# Patient Record
Sex: Female | Born: 1990 | Race: White | Hispanic: No | Marital: Married | Smoking: Never smoker
Health system: Southern US, Community
[De-identification: ages and names within clinical notes are randomized; demographics above are authoritative.]

## PROBLEM LIST (undated history)

## (undated) DIAGNOSIS — R102 Pelvic and perineal pain: Secondary | ICD-10-CM

## (undated) DIAGNOSIS — R519 Headache, unspecified: Secondary | ICD-10-CM

## (undated) DIAGNOSIS — N809 Endometriosis, unspecified: Secondary | ICD-10-CM

## (undated) DIAGNOSIS — T4145XA Adverse effect of unspecified anesthetic, initial encounter: Secondary | ICD-10-CM

## (undated) DIAGNOSIS — K219 Gastro-esophageal reflux disease without esophagitis: Secondary | ICD-10-CM

## (undated) DIAGNOSIS — T8859XA Other complications of anesthesia, initial encounter: Secondary | ICD-10-CM

## (undated) DIAGNOSIS — Z8489 Family history of other specified conditions: Secondary | ICD-10-CM

## (undated) HISTORY — PX: WISDOM TOOTH EXTRACTION: SHX21

---
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
## 2012-07-16 ENCOUNTER — Telehealth: Payer: Self-pay | Admitting: Genetic Counselor

## 2012-07-16 NOTE — Telephone Encounter (Signed)
S/W pt in re Genetic Testing 1/13 @ 11 w/[employer] [employer]. Welcome packet mailed.

## 2012-09-27 ENCOUNTER — Encounter: Payer: Self-pay | Admitting: Genetic Counselor

## 2012-09-27 ENCOUNTER — Other Ambulatory Visit: Payer: Self-pay | Admitting: Lab

## 2012-09-29 ENCOUNTER — Telehealth: Payer: Self-pay | Admitting: Genetic Counselor

## 2012-09-29 NOTE — Telephone Encounter (Signed)
Per message from switchboard left 1/13. Pt called to cx'd 1/13 appt due to she is now living out of town.

## 2015-03-26 ENCOUNTER — Encounter: Payer: Self-pay | Admitting: Adult Health

## 2015-05-20 ENCOUNTER — Emergency Department (HOSPITAL_COMMUNITY)
Admission: EM | Admit: 2015-05-20 | Discharge: 2015-05-20 | Disposition: A | Discharge status: Home / Self Care | Payer: BC Managed Care – PPO | Source: Home / Self Care | Attending: Family Medicine | Admitting: Family Medicine

## 2015-05-20 ENCOUNTER — Encounter (HOSPITAL_COMMUNITY): Payer: Self-pay | Admitting: Emergency Medicine

## 2015-05-20 DIAGNOSIS — H9201 Otalgia, right ear: Secondary | ICD-10-CM | POA: Diagnosis not present

## 2015-05-20 DIAGNOSIS — H6501 Acute serous otitis media, right ear: Secondary | ICD-10-CM

## 2015-05-20 MED ORDER — AMOXICILLIN 500 MG PO CAPS: 500.0000 mg | ORAL_CAPSULE | Freq: Two times a day (BID) | ORAL | Status: DC

## 2015-05-20 MED ORDER — IBUPROFEN 800 MG PO TABS
800.0000 mg | ORAL_TABLET | Freq: Three times a day (TID) | ORAL | Status: DC
Start: 2015-05-20 — End: 2019-11-07

## 2015-05-20 MED ORDER — IBUPROFEN 800 MG PO TABS
ORAL_TABLET | ORAL | Status: AC
  Filled 2015-05-20: qty 1

## 2015-05-20 MED ORDER — IBUPROFEN 800 MG PO TABS
800.0000 mg | ORAL_TABLET | Freq: Once | ORAL | Status: AC
  Administered 2015-05-20: 800 mg via ORAL

## 2015-05-20 NOTE — ED Notes (Signed)
C/o right ear pain onset yest associated w/dizziness Has applied OTC ear drops that has made it worse Alert... No acute distress.

## 2015-05-20 NOTE — Discharge Instructions (Signed)
Otitis Media With Effusion Otitis media with effusion is the presence of fluid in the middle ear. This is a common problem in children, which often follows ear infections. It may be present for weeks or longer after the infection. Unlike an acute ear infection, otitis media with effusion refers only to fluid behind the ear drum and not infection. Children with repeated ear and sinus infections and allergy problems are the most likely to get otitis media with effusion. CAUSES  The most frequent cause of the fluid buildup is dysfunction of the eustachian tubes. These are the tubes that drain fluid in the ears to the back of the nose (nasopharynx). SYMPTOMS   The main symptom of this condition is hearing loss. As a result, you or your child may:  Listen to the TV at a loud volume.  Not respond to questions.  Ask "what" often when spoken to.  Mistake or confuse one sound or word for another.  There may be a sensation of fullness or pressure but usually not pain. DIAGNOSIS   Your health care provider will diagnose this condition by examining you or your child's ears.  Your health care provider may test the pressure in you or your child's ear with a tympanometer.  A hearing test may be conducted if the problem persists. TREATMENT   Treatment depends on the duration and the effects of the effusion.  Antibiotics, decongestants, nose drops, and cortisone-type drugs (tablets or nasal spray) may not be helpful.  Children with persistent ear effusions may have delayed language or behavioral problems. Children at risk for developmental delays in hearing, learning, and speech may require referral to a specialist earlier than children not at risk.  You or your child's health care provider may suggest a referral to an ear, nose, and throat surgeon for treatment. The following may help restore normal hearing:  Drainage of fluid.  Placement of ear tubes (tympanostomy tubes).  Removal of adenoids  (adenoidectomy). HOME CARE INSTRUCTIONS   Avoid secondhand smoke.  Infants who are breastfed are less likely to have this condition.  Avoid feeding infants while they are lying flat.  Avoid known environmental allergens.  Avoid people who are sick. SEEK MEDICAL CARE IF:   Hearing is not better in 3 months.  Hearing is worse.  Ear pain.  Drainage from the ear.  Dizziness. MAKE SURE YOU:   Understand these instructions.  Will watch your condition.  Will get help right away if you are not doing well or get worse. Document Released: 10/09/2004 Document Revised: 01/16/2014 Document Reviewed: 03/29/2013 [facility] Patient Information 2015 [place], [place]. This information is not intended to replace advice given to you by your health care provider. Make sure you discuss any questions you have with your health care provider.    You have an ear infection. Treat with antibiotics-please take all of them. Use Ibuprofen (RX) every 8 hours in conjenction with Tylenol ES every 4 hours. Feel better soon. Also Mucinex Maxium Strength as directed will also help. Avoid decongestants!

## 2015-05-20 NOTE — ED Provider Notes (Signed)
CSN: [id number]     Arrival date & time 05/20/15  1834 History   First MD Initiated Contact with Patient 05/20/15 1857     Chief Complaint  Patient presents with  . Otalgia   (Consider location/radiation/quality/duration/timing/severity/associated sxs/prior Treatment) HPI Comments: Patient presents with "severe" right ear pain that began yesterday. No prior cold symptoms, no recent swimming. Subjective fevers and malaise is noted.   Patient is a 24 y.o. female presenting with ear pain. The history is provided by the patient.  Otalgia Associated symptoms: fever   Associated symptoms: no rhinorrhea and no sore throat     History reviewed. No pertinent past medical history. History reviewed. No pertinent past surgical history. No family history on file. Social History  Substance Use Topics  . Smoking status: Never Smoker   . Smokeless tobacco: None  . Alcohol Use: No   OB History    No data available     Review of Systems  Constitutional: Positive for fever and fatigue. Negative for chills.  HENT: Positive for ear pain. Negative for rhinorrhea, sinus pressure, sneezing and sore throat.   Eyes: Negative.   Respiratory: Negative.   Skin: Negative.   Neurological: Positive for dizziness.    Allergies  Review of patient's allergies indicates no known allergies.  Home Medications   Prior to Admission medications   Medication Sig Start Date End Date Taking? Authorizing Provider  amoxicillin (AMOXIL) 500 MG capsule Take 1 capsule (500 mg total) by mouth 2 (two) times daily. 05/20/15   Riki Sheer, PA-C  ibuprofen (ADVIL,MOTRIN) 800 MG tablet Take 1 tablet (800 mg total) by mouth 3 (three) times daily. 05/20/15   Riki Sheer, PA-C   Meds Ordered and Administered this Visit   Medications  ibuprofen (ADVIL,MOTRIN) tablet 800 mg (not administered)    BP 113/64 mmHg  Pulse 74  Temp(Src) 98.4 F (36.9 C) (Oral)  Resp 10  SpO2 99%  LMP 05/20/2015 No data  found.   Physical Exam  Constitutional: She is oriented to person, place, and time. She appears well-developed and well-nourished.  HENT:  Head: Normocephalic and atraumatic.  Left Ear: External ear normal.  Mouth/Throat: Oropharynx is clear and moist.  Right ear with retro TM fluid, effusion and erythema. Painful exam.   Neck: Normal range of motion.  Pulmonary/Chest: Effort normal.  Neurological: She is alert and oriented to person, place, and time.  Skin: Skin is warm and dry. No rash noted.  Psychiatric: Her behavior is normal.  Nursing note and vitals reviewed.   ED Course  Procedures (including critical care time)  Labs Review Labs Reviewed - No data to display  Imaging Review No results found.   Visual Acuity Review  Right Eye Distance:   Left Eye Distance:   Bilateral Distance:    Right Eye Near:   Left Eye Near:    Bilateral Near:         MDM   1. Right acute serous otitis media, recurrence not specified   2. Ear pain, right    Cover with antibiotic and supportive care. F/U if worsens.     Riki Sheer, PA-C 05/20/15 [phone]

## 2016-11-13 ENCOUNTER — Other Ambulatory Visit: Payer: Self-pay | Admitting: General Surgery

## 2016-11-13 DIAGNOSIS — N9089 Other specified noninflammatory disorders of vulva and perineum: Secondary | ICD-10-CM

## 2016-11-13 DIAGNOSIS — R221 Localized swelling, mass and lump, neck: Secondary | ICD-10-CM

## 2016-11-21 ENCOUNTER — Ambulatory Visit
Admission: RE | Admit: 2016-11-21 | Discharge: 2016-11-21 | Disposition: A | Discharge status: Home / Self Care | Payer: BC Managed Care – PPO | Source: Ambulatory Visit | Attending: General Surgery | Admitting: General Surgery

## 2016-11-21 DIAGNOSIS — R221 Localized swelling, mass and lump, neck: Secondary | ICD-10-CM

## 2016-11-21 DIAGNOSIS — N9089 Other specified noninflammatory disorders of vulva and perineum: Secondary | ICD-10-CM

## 2017-08-28 IMAGING — US US SOFT TISSUE HEAD/NECK
1 series · 3 of 3 positions shown · non-contrast
Comparison: None.

CLINICAL DATA: 25-year-old female with palpable abnormality of the
left neck near the angle of the mandible and the ear.

EXAM:
ULTRASOUND OF HEAD/NECK SOFT TISSUES
TECHNIQUE: Ultrasound examination of the head and neck soft tissues was
performed in the area of clinical concern.

[Series 1: us soft tissue head/neck · 0.08mm/px · 3 of 3 slices shown]
[im 1/3]
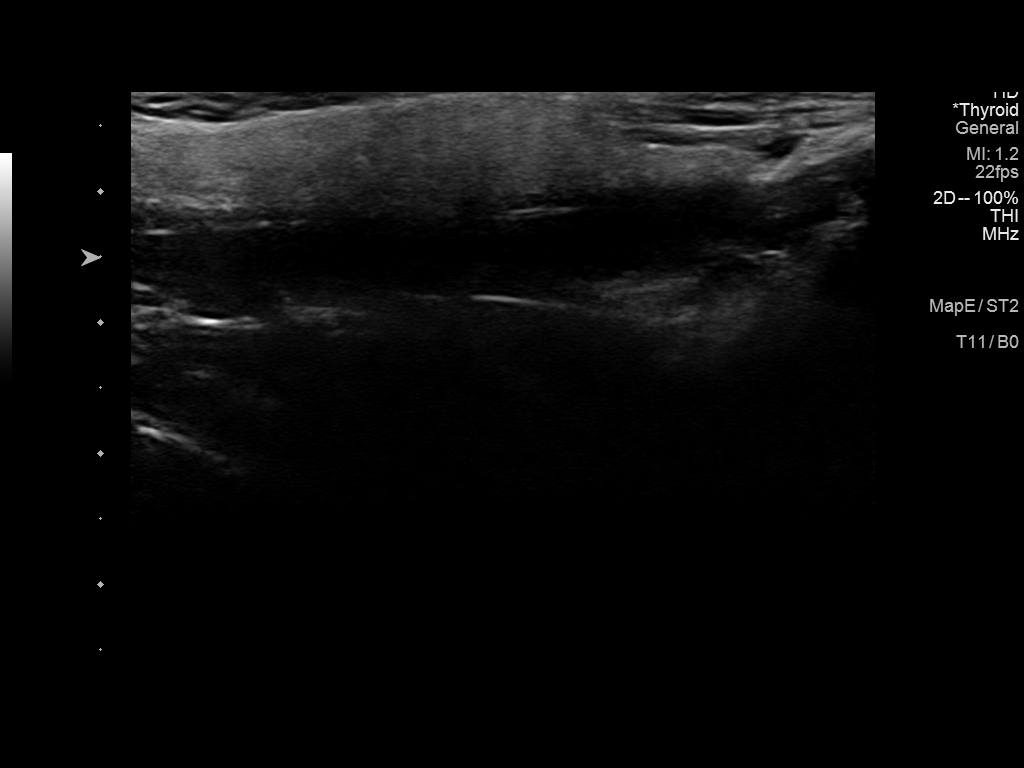
[im 2/3]
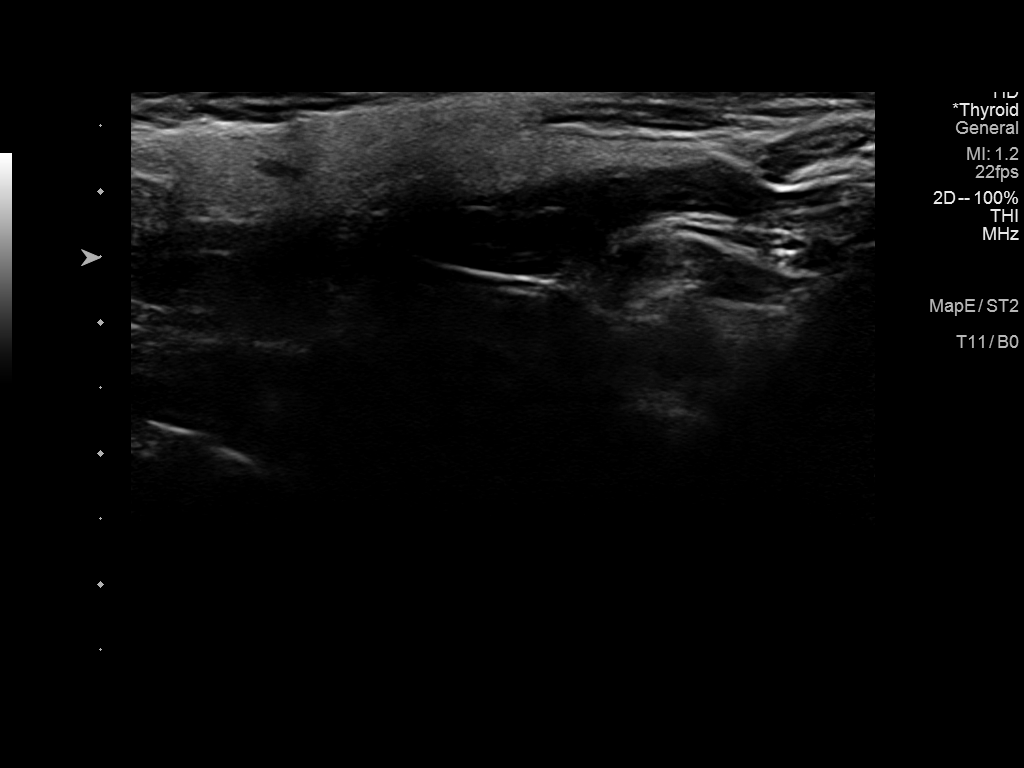
[im 3/3]
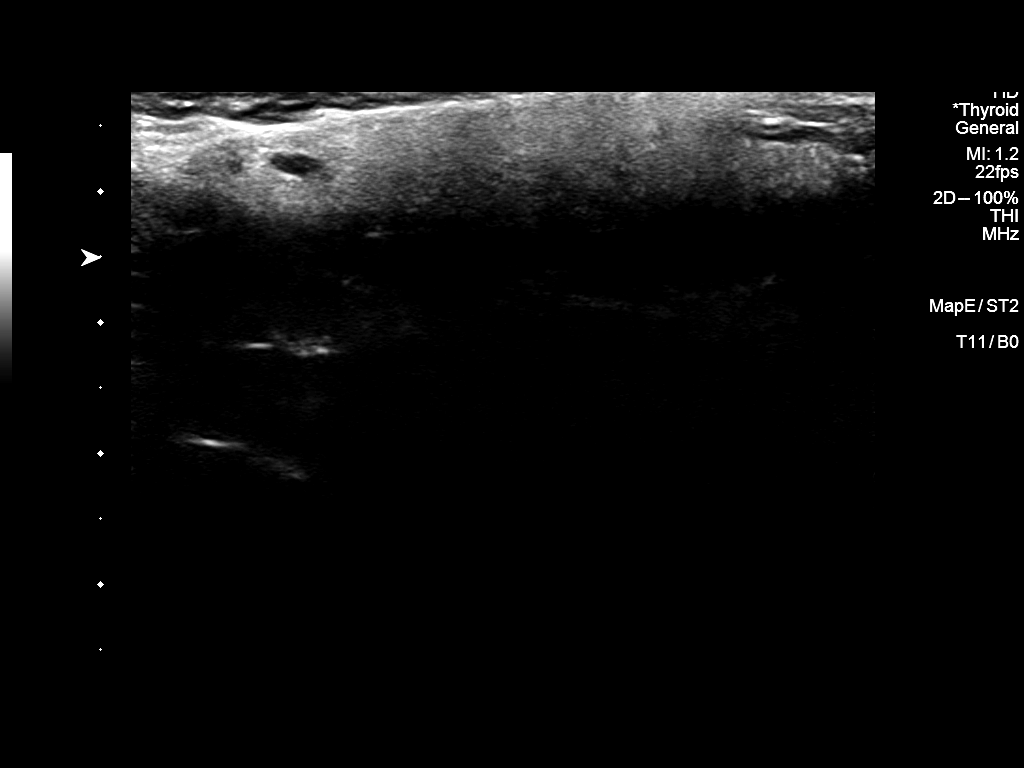

[3 of 3 positions shown; findings below may reference images not displayed]

FINDINGS: Grayscale images in the area of clinical concern demonstrate a
portion of the left parotid gland and otherwise normal appearing
fibromuscular architecture. No discrete mass or lesion is
identified.
IMPRESSION: No sonographic abnormality identified at the left neck area of
clinical concern.

If the area enlarges or fails to resolve then recommend follow-up
Neck CT with IV contrast.

## 2017-12-06 IMAGING — US US PELVIS LIMITED
1 series · 12 of 12 positions shown · non-contrast
Comparison: None.

CLINICAL DATA: Palpable knot in the right labia.

EXAM:
LIMITED ULTRASOUND OF PELVIS
TECHNIQUE: Limited transabdominal ultrasound examination of the pelvis was
performed.

[Series 1: us pelvis limited · 0.06mm/px · 12 of 12 slices shown]
[im 1/12]
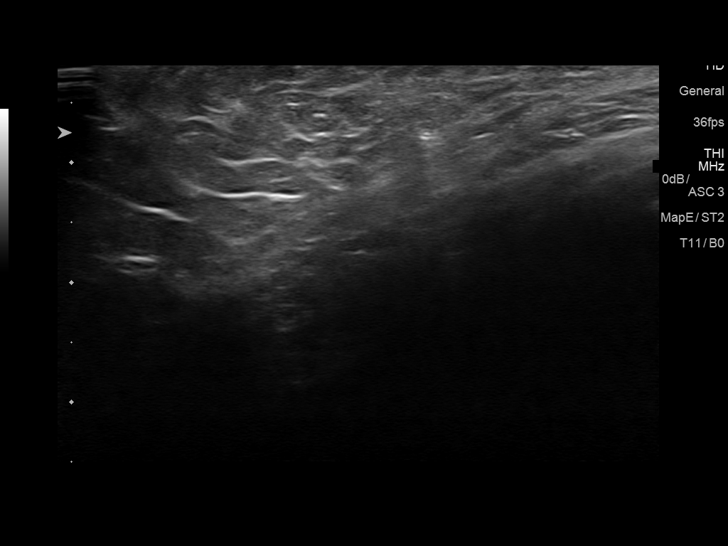
[im 2/12]
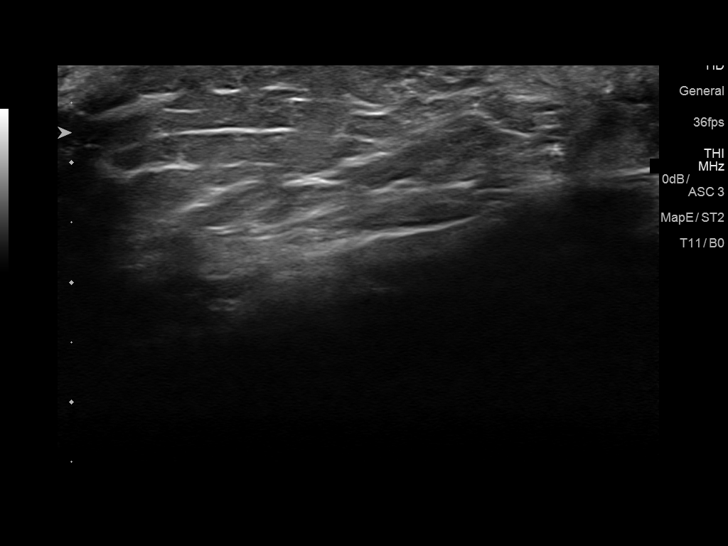
[im 3/12]
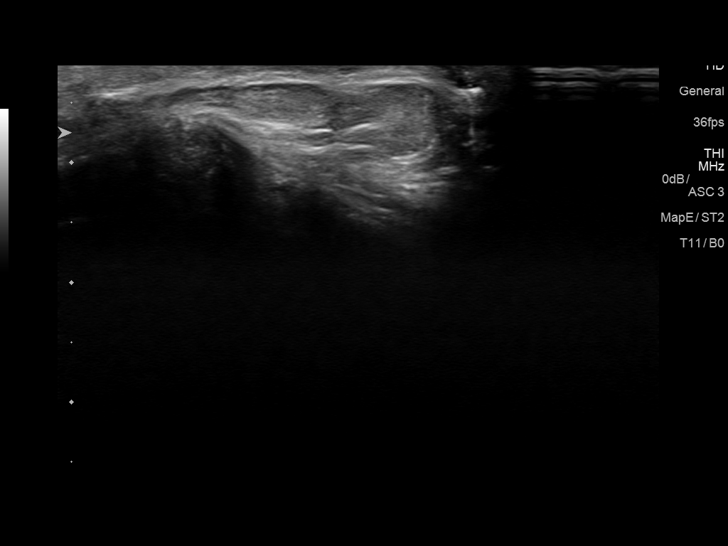
[im 4/12]
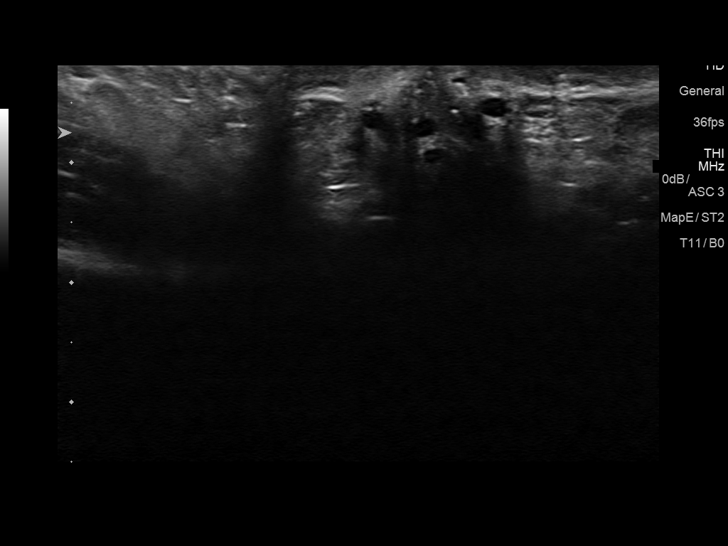
[im 5/12]
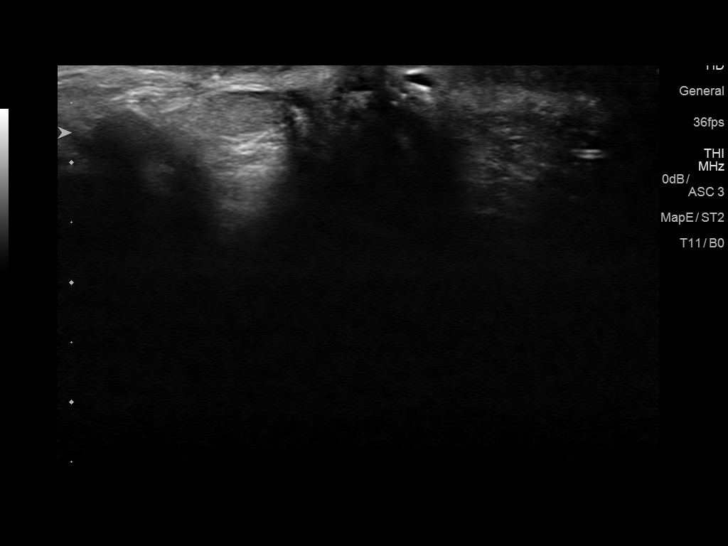
[im 6/12]
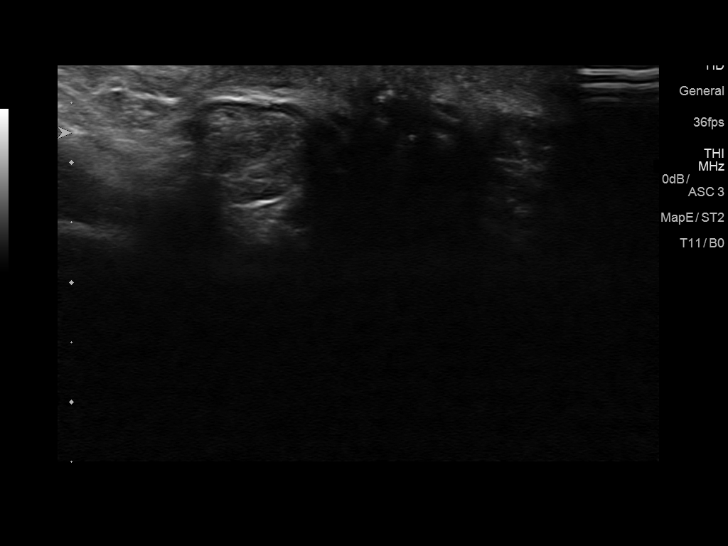
[im 7/12]
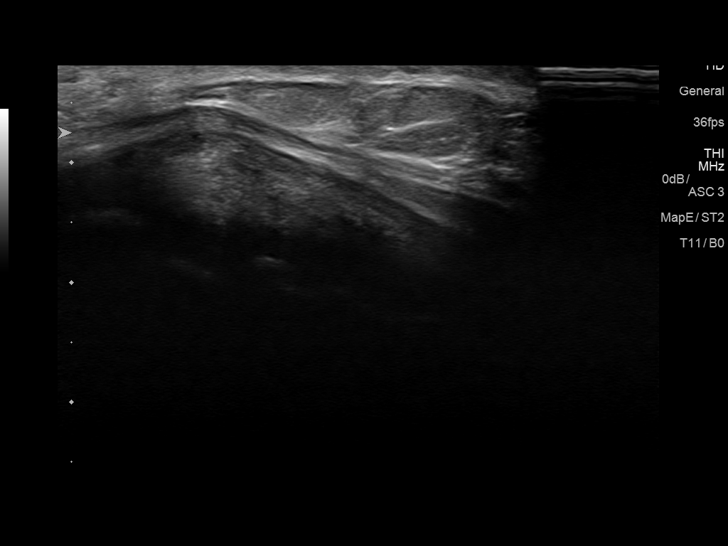
[im 8/12]
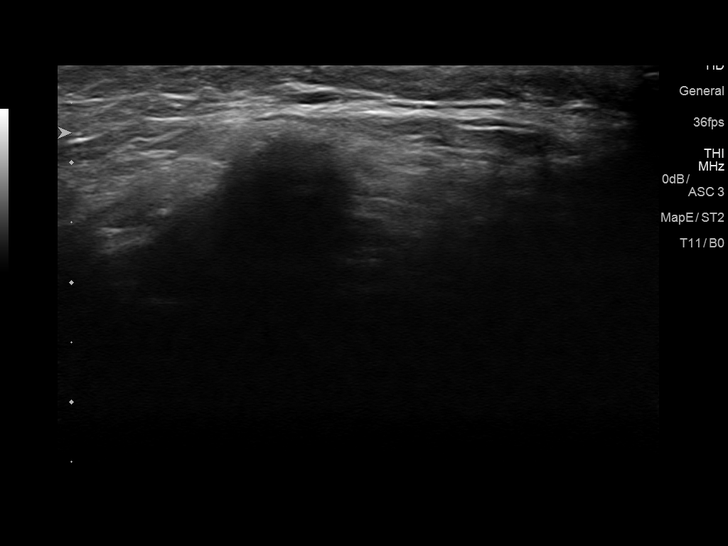
[im 9/12]
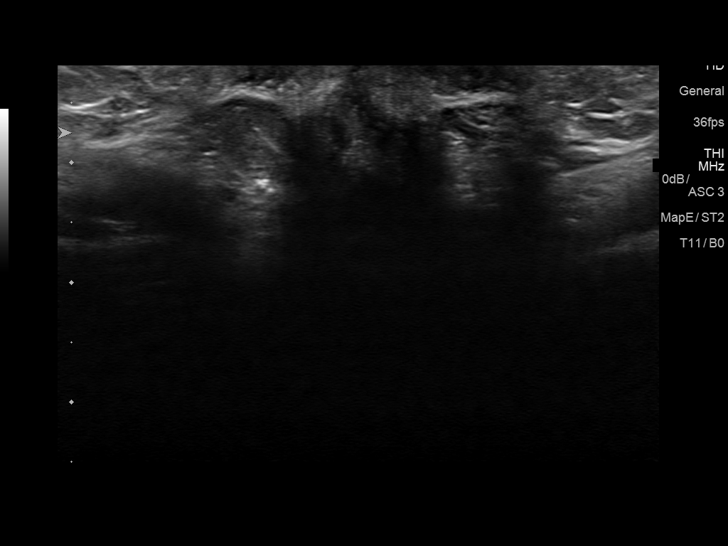
[im 10/12]
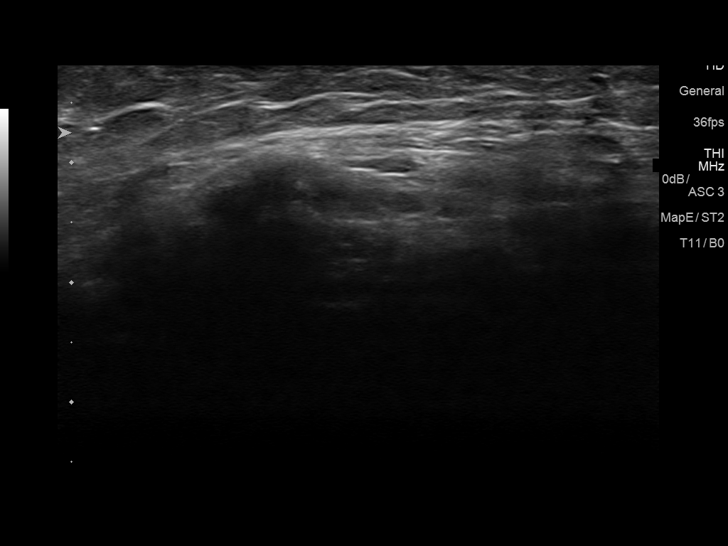
[im 11/12]
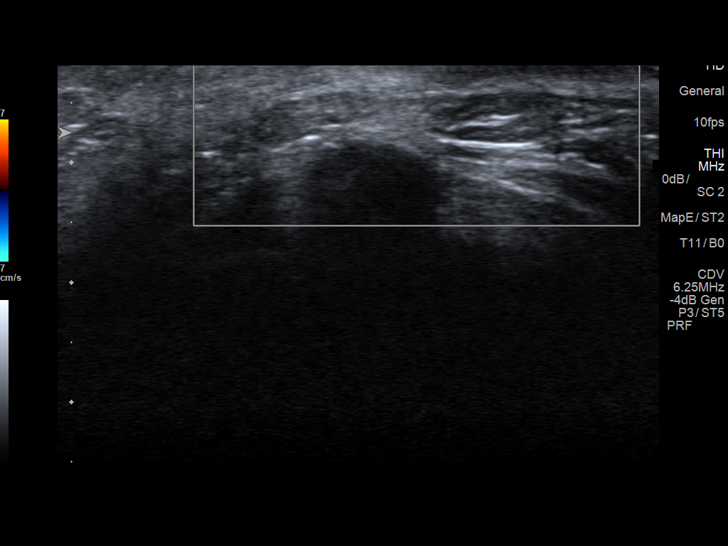
[im 12/12]
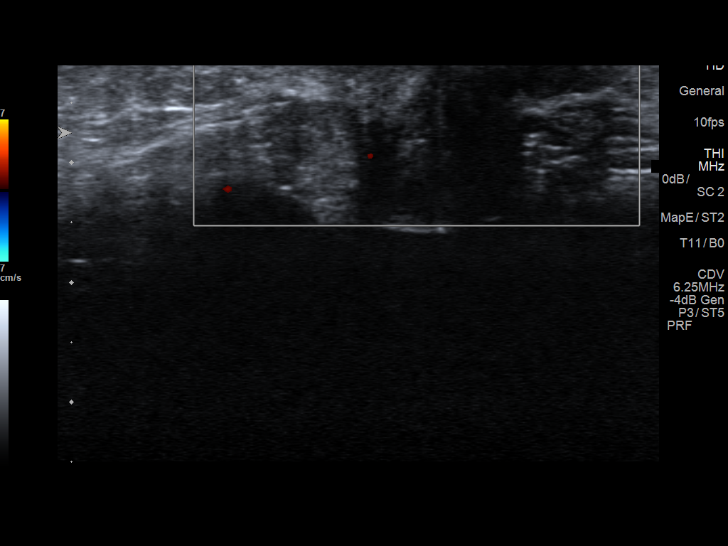

[12 of 12 positions shown; findings below may reference images not displayed]

FINDINGS: At the area of palpable concern as indicated by the patient in the
right labia, there is a 1.8 x 0.6 x 0.9 cm bilobed hypoechoic
subcutaneous mass with echogenicity similar to the surrounding fat
and no internal vascularity, calcification or cystic spaces
demonstrated. No additional masses are demonstrated.
IMPRESSION: Bilobed 1.8 x 0.6 x 0.9 cm hypoechoic subcutaneous mass identified
at the area of palpable concern as indicated by the patient in the
right labia. This mass is indeterminate and may represent a lipoma
given echogenicity similar to the surrounding fat. Further
management, including any decision to pursue follow-up ultrasound or
additional imaging such as MRI pelvis without and with IV contrast,
should be based on clinical assessment.

## 2019-11-03 ENCOUNTER — Other Ambulatory Visit: Payer: Self-pay | Admitting: Obstetrics and Gynecology

## 2019-11-05 ENCOUNTER — Other Ambulatory Visit (HOSPITAL_COMMUNITY)
Admission: RE | Admit: 2019-11-05 | Discharge: 2019-11-05 | Disposition: A | Discharge status: Home / Self Care | Payer: 59 | Source: Ambulatory Visit | Attending: Obstetrics and Gynecology | Admitting: Obstetrics and Gynecology

## 2019-11-05 DIAGNOSIS — Z01812 Encounter for preprocedural laboratory examination: Secondary | ICD-10-CM | POA: Diagnosis not present

## 2019-11-05 DIAGNOSIS — Z20822 Contact with and (suspected) exposure to covid-19: Secondary | ICD-10-CM | POA: Insufficient documentation

## 2019-11-05 LAB — SARS CORONAVIRUS 2 (TAT 6-24 HRS): SARS Coronavirus 2: NEGATIVE

## 2019-11-07 ENCOUNTER — Encounter (HOSPITAL_BASED_OUTPATIENT_CLINIC_OR_DEPARTMENT_OTHER): Payer: Self-pay | Admitting: Obstetrics and Gynecology

## 2019-11-07 ENCOUNTER — Other Ambulatory Visit: Payer: Self-pay

## 2019-11-07 NOTE — Progress Notes (Signed)
Spoke w/ via phone for pre-op interview---Angela Anthony needs dos---- urine poct            COVID test ------11-05-2019 Arrive at -------1145 am 11-09-2019 No food after midnight clear liquids until 745 am then npo Medications to take morning of surgery -----none Diabetic medication -----n/a Patient Special Instructions ----- Pre-Op special Istructions ----- Patient verbalized understanding of instructions that were given at this phone interview. Patient denies shortness of breath, chest pain, fever, cough a this phone interview.

## 2019-11-07 NOTE — H&P (Signed)
Angela Anthony is a 29 y.o. female, G:0 presenting for laparoscopy, possible resection of endometriosis  and chromopertubation because of chronic pelvic pain. The patient has a history of endometriosis that was well managed with oral contraceptives.  She decided to  discontinue  them in September 2020 however,  in order to conceive  and developed severe,  debilitating dysmenorrhea. Her menstrual cycle was every 21-26 days with a flow that lasted 5 days and the  volume was, in her words, normal.  During this time,  she begins to  experience severe pelvic pain that radiates to her back and down her legs. She rates this pain as 10/10 and it is not relieved with Ibuprofen 800 mg. She denies any changes in bowel or bladder function except during her menses she has pain with voiding and diarrhea..  She does admit to dyspareunia but denies vaginitis symptoms. Given her past history of endometriosis and the characteristics of her periods while not on hormonal therapy, the patient has chosen to proceed with surgical evaluation and possible management of her symptoms.   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 Compazine [Prochlorperazine]     " felt like paralyzed and breathing thru a straw"  . Morphine And Related     " does not control pain well"  . Other     opiods do not help with pain    ROS: Admits to PMS symptoms, dysuria with menses, occasional migraine,  diarrhea with menses, occasional constipation and  dyspareunia but denies vision changes, nasal congestion, dysphagia, tinnitus, dizziness, hoarseness, cough,  chest pain, shortness of breath, nausea, vomiting, urinary frequency, urgency  hematuria, vaginitis symptoms, pelvic pain, swelling of joints,easy bruising,  myalgias, arthralgias, skin rashes, unexplained weight loss and except as is mentioned in the history of present illness, patient's review of systems is otherwise negative.   Physical Exam  Bp:110/70;   Weight: 168 lbs ;  Height: 5'3" BMI: 29.8   Neck: supple without masses or thyromegaly Lungs: clear to auscultation Heart: regular rate and rhythm Abdomen: soft, non-tender and no organomegaly Pelvic:EGBUS- wnl; vagina-normal rugae; uterus- retroverted amd normal size, cervix without lesions or motion tenderness; adnexae-no tenderness or masses Extremities:  no clubbing, cyanosis or edema   Assesment:  Chronic Pelvic Pain    History of Endometriosis                          Disposition:  A discussion was held with patient regarding the indication for her procedure(s) along with the risks, which include but are not limited to: reaction to anesthesia, damage to adjacent organs, infection and excessive bleeding. The patient verbalized understanding of these risks and has consented to proceed with Laparoscopy, Possible Resection  of Endometriosis and Chromopertubation at [facility] on November 09, 2019 @ 1:45 p.m.   CSN# [id number]   Tracie Lindbloom J. Lowell [employer], PA-C  for Dr. Crist Fat. Rivard

## 2019-11-09 ENCOUNTER — Encounter (HOSPITAL_BASED_OUTPATIENT_CLINIC_OR_DEPARTMENT_OTHER)
Admission: RE | Disposition: A | Discharge status: Home / Self Care | Payer: Self-pay | Source: Home / Self Care | Attending: Obstetrics and Gynecology

## 2019-11-09 ENCOUNTER — Ambulatory Visit (HOSPITAL_BASED_OUTPATIENT_CLINIC_OR_DEPARTMENT_OTHER): Payer: 59 | Admitting: Anesthesiology

## 2019-11-09 ENCOUNTER — Encounter (HOSPITAL_BASED_OUTPATIENT_CLINIC_OR_DEPARTMENT_OTHER): Payer: Self-pay | Admitting: Obstetrics and Gynecology

## 2019-11-09 ENCOUNTER — Ambulatory Visit (HOSPITAL_BASED_OUTPATIENT_CLINIC_OR_DEPARTMENT_OTHER)
Admission: RE | Admit: 2019-11-09 | Discharge: 2019-11-09 | Disposition: A | Discharge status: Home / Self Care | Payer: 59 | Attending: Obstetrics and Gynecology | Admitting: Obstetrics and Gynecology

## 2019-11-09 DIAGNOSIS — N801 Endometriosis of ovary: Secondary | ICD-10-CM | POA: Insufficient documentation

## 2019-11-09 DIAGNOSIS — R102 Pelvic and perineal pain unspecified side: Secondary | ICD-10-CM

## 2019-11-09 DIAGNOSIS — N803 Endometriosis of pelvic peritoneum: Secondary | ICD-10-CM | POA: Insufficient documentation

## 2019-11-09 DIAGNOSIS — Z803 Family history of malignant neoplasm of breast: Secondary | ICD-10-CM | POA: Insufficient documentation

## 2019-11-09 DIAGNOSIS — N838 Other noninflammatory disorders of ovary, fallopian tube and broad ligament: Secondary | ICD-10-CM | POA: Insufficient documentation

## 2019-11-09 DIAGNOSIS — Z87891 Personal history of nicotine dependence: Secondary | ICD-10-CM | POA: Diagnosis not present

## 2019-11-09 DIAGNOSIS — G8929 Other chronic pain: Secondary | ICD-10-CM | POA: Insufficient documentation

## 2019-11-09 DIAGNOSIS — N736 Female pelvic peritoneal adhesions (postinfective): Secondary | ICD-10-CM | POA: Diagnosis present

## 2019-11-09 DIAGNOSIS — Z8043 Family history of malignant neoplasm of testis: Secondary | ICD-10-CM | POA: Insufficient documentation

## 2019-11-09 DIAGNOSIS — N809 Endometriosis, unspecified: Secondary | ICD-10-CM

## 2019-11-09 DIAGNOSIS — N83201 Unspecified ovarian cyst, right side: Secondary | ICD-10-CM | POA: Diagnosis not present

## 2019-11-09 DIAGNOSIS — Z800 Family history of malignant neoplasm of digestive organs: Secondary | ICD-10-CM | POA: Diagnosis not present

## 2019-11-09 DIAGNOSIS — F419 Anxiety disorder, unspecified: Secondary | ICD-10-CM | POA: Diagnosis not present

## 2019-11-09 DIAGNOSIS — Z885 Allergy status to narcotic agent status: Secondary | ICD-10-CM | POA: Diagnosis not present

## 2019-11-09 DIAGNOSIS — Z808 Family history of malignant neoplasm of other organs or systems: Secondary | ICD-10-CM | POA: Insufficient documentation

## 2019-11-09 DIAGNOSIS — Z888 Allergy status to other drugs, medicaments and biological substances status: Secondary | ICD-10-CM | POA: Diagnosis not present

## 2019-11-09 HISTORY — PX: CHROMOPERTUBATION: SHX6288

## 2019-11-09 HISTORY — DX: Headache, unspecified: R51.9

## 2019-11-09 HISTORY — DX: Pelvic and perineal pain: R10.2

## 2019-11-09 HISTORY — PX: LAPAROSCOPY: SHX197

## 2019-11-09 HISTORY — DX: Other complications of anesthesia, initial encounter: T88.59XA

## 2019-11-09 HISTORY — PX: ROBOTIC ASSISTED LAPAROSCOPIC LYSIS OF ADHESION: SHX6080

## 2019-11-09 HISTORY — DX: Endometriosis, unspecified: N80.9

## 2019-11-09 HISTORY — DX: Gastro-esophageal reflux disease without esophagitis: K21.9

## 2019-11-09 HISTORY — DX: Family history of other specified conditions: Z84.89

## 2019-11-09 LAB — POCT PREGNANCY, URINE: Preg Test, Ur: NEGATIVE

## 2019-11-09 SURGERY — LYSIS, ADHESIONS, ROBOT-ASSISTED, LAPAROSCOPIC
Anesthesia: General | Site: Abdomen | Laterality: Bilateral

## 2019-11-09 MED ORDER — ROCURONIUM BROMIDE 10 MG/ML (PF) SYRINGE
PREFILLED_SYRINGE | INTRAVENOUS | Status: AC
  Filled 2019-11-09: qty 10

## 2019-11-09 MED ORDER — KETOROLAC TROMETHAMINE 30 MG/ML IJ SOLN
INTRAMUSCULAR | Status: AC
  Filled 2019-11-09: qty 1

## 2019-11-09 MED ORDER — DEXAMETHASONE SODIUM PHOSPHATE 10 MG/ML IJ SOLN
INTRAMUSCULAR | Status: AC
  Filled 2019-11-09: qty 1

## 2019-11-09 MED ORDER — LACTATED RINGERS IV SOLN
INTRAVENOUS | Status: DC
  Filled 2019-11-09 (×2): qty 1000

## 2019-11-09 MED ORDER — FENTANYL CITRATE (PF) 100 MCG/2ML IJ SOLN
INTRAMUSCULAR | Status: DC | PRN
  Administered 2019-11-09 (×6): 50 ug via INTRAVENOUS

## 2019-11-09 MED ORDER — MIDAZOLAM HCL 5 MG/5ML IJ SOLN
INTRAMUSCULAR | Status: DC | PRN
  Administered 2019-11-09: 2 mg via INTRAVENOUS

## 2019-11-09 MED ORDER — BUPIVACAINE HCL (PF) 0.25 % IJ SOLN
INTRAMUSCULAR | Status: DC | PRN
  Administered 2019-11-09: 25 mL
  Administered 2019-11-09: 5 mL

## 2019-11-09 MED ORDER — OXYCODONE HCL 5 MG PO TABS
5.0000 mg | ORAL_TABLET | Freq: Once | ORAL | Status: AC | PRN
  Administered 2019-11-09: 5 mg via ORAL
  Filled 2019-11-09: qty 1

## 2019-11-09 MED ORDER — HYDROMORPHONE HCL 1 MG/ML IJ SOLN
0.2500 mg | INTRAMUSCULAR | Status: DC | PRN
  Administered 2019-11-09: 0.25 mg via INTRAVENOUS
  Filled 2019-11-09: qty 0.5

## 2019-11-09 MED ORDER — KETOROLAC TROMETHAMINE 60 MG/2ML IM SOLN
INTRAMUSCULAR | Status: DC | PRN
  Administered 2019-11-09: 30 mg via INTRAMUSCULAR

## 2019-11-09 MED ORDER — SODIUM CHLORIDE (PF) 0.9 % IJ SOLN
INTRAMUSCULAR | Status: AC
  Filled 2019-11-09: qty 10

## 2019-11-09 MED ORDER — ONDANSETRON HCL 4 MG/2ML IJ SOLN
INTRAMUSCULAR | Status: DC | PRN
  Administered 2019-11-09: 4 mg via INTRAVENOUS

## 2019-11-09 MED ORDER — SODIUM CHLORIDE 0.9 % IV SOLN
INTRAVENOUS | Status: DC | PRN
  Administered 2019-11-09: 40 mL
  Administered 2019-11-09: 60 mL

## 2019-11-09 MED ORDER — ROCURONIUM BROMIDE 100 MG/10ML IV SOLN
INTRAVENOUS | Status: DC | PRN
  Administered 2019-11-09 (×3): 20 mg via INTRAVENOUS
  Administered 2019-11-09: 10 mg via INTRAVENOUS
  Administered 2019-11-09: 70 mg via INTRAVENOUS

## 2019-11-09 MED ORDER — FENTANYL CITRATE (PF) 100 MCG/2ML IJ SOLN
INTRAMUSCULAR | Status: AC
  Filled 2019-11-09: qty 2

## 2019-11-09 MED ORDER — SCOPOLAMINE 1 MG/3DAYS TD PT72
1.0000 | MEDICATED_PATCH | TRANSDERMAL | Status: DC
  Administered 2019-11-09: 1.5 mg via TRANSDERMAL
  Filled 2019-11-09: qty 1

## 2019-11-09 MED ORDER — KETAMINE HCL 10 MG/ML IJ SOLN
INTRAMUSCULAR | Status: DC | PRN
  Administered 2019-11-09: 20 mg via INTRAVENOUS

## 2019-11-09 MED ORDER — SCOPOLAMINE 1 MG/3DAYS TD PT72
MEDICATED_PATCH | TRANSDERMAL | Status: AC
  Filled 2019-11-09: qty 1

## 2019-11-09 MED ORDER — ONDANSETRON HCL 4 MG/2ML IJ SOLN
4.0000 mg | Freq: Once | INTRAMUSCULAR | Status: AC | PRN
  Administered 2019-11-09: 4 mg via INTRAVENOUS
  Filled 2019-11-09: qty 2

## 2019-11-09 MED ORDER — OXYCODONE HCL 5 MG PO TABS: ORAL_TABLET | ORAL | 0 refills | Status: AC

## 2019-11-09 MED ORDER — KETOROLAC TROMETHAMINE 30 MG/ML IJ SOLN
INTRAMUSCULAR | Status: DC | PRN
  Administered 2019-11-09: 30 mg via INTRAVENOUS

## 2019-11-09 MED ORDER — KETAMINE HCL 10 MG/ML IJ SOLN
INTRAMUSCULAR | Status: AC
  Filled 2019-11-09: qty 1

## 2019-11-09 MED ORDER — ONDANSETRON HCL 4 MG/2ML IJ SOLN
INTRAMUSCULAR | Status: AC
  Filled 2019-11-09: qty 2

## 2019-11-09 MED ORDER — SUGAMMADEX SODIUM 200 MG/2ML IV SOLN
INTRAVENOUS | Status: DC | PRN
  Administered 2019-11-09: 225 mg via INTRAVENOUS

## 2019-11-09 MED ORDER — METHYLENE BLUE 0.5 % INJ SOLN
INTRAVENOUS | Status: DC | PRN
  Administered 2019-11-09: 5 mL

## 2019-11-09 MED ORDER — HYDROMORPHONE HCL 1 MG/ML IJ SOLN
0.2500 mg | INTRAMUSCULAR | Status: DC | PRN
  Filled 2019-11-09: qty 0.5

## 2019-11-09 MED ORDER — LIDOCAINE 2% (20 MG/ML) 5 ML SYRINGE
INTRAMUSCULAR | Status: AC
  Filled 2019-11-09: qty 10

## 2019-11-09 MED ORDER — ACETAMINOPHEN 500 MG PO TABS
ORAL_TABLET | ORAL | Status: AC
  Filled 2019-11-09: qty 2

## 2019-11-09 MED ORDER — PROPOFOL 10 MG/ML IV BOLUS
INTRAVENOUS | Status: DC | PRN
  Administered 2019-11-09 (×2): 50 mg via INTRAVENOUS
  Administered 2019-11-09: 200 mg via INTRAVENOUS

## 2019-11-09 MED ORDER — DEXAMETHASONE SODIUM PHOSPHATE 4 MG/ML IJ SOLN
INTRAMUSCULAR | Status: DC | PRN
  Administered 2019-11-09: 10 mg via INTRAVENOUS

## 2019-11-09 MED ORDER — LIDOCAINE 2% (20 MG/ML) 5 ML SYRINGE
INTRAMUSCULAR | Status: DC | PRN
  Administered 2019-11-09: 1.5 mg/kg/h via INTRAVENOUS

## 2019-11-09 MED ORDER — ACETAMINOPHEN 500 MG PO TABS
1000.0000 mg | ORAL_TABLET | Freq: Once | ORAL | Status: AC
  Administered 2019-11-09: 1000 mg via ORAL
  Filled 2019-11-09: qty 2

## 2019-11-09 MED ORDER — OXYCODONE HCL 5 MG/5ML PO SOLN
5.0000 mg | Freq: Once | ORAL | Status: AC | PRN
  Filled 2019-11-09: qty 5

## 2019-11-09 MED ORDER — OXYCODONE HCL 5 MG PO TABS
ORAL_TABLET | ORAL | Status: AC
  Filled 2019-11-09: qty 1

## 2019-11-09 MED ORDER — LIDOCAINE HCL (CARDIAC) PF 100 MG/5ML IV SOSY
PREFILLED_SYRINGE | INTRAVENOUS | Status: DC | PRN
  Administered 2019-11-09: 60 mg via INTRAVENOUS

## 2019-11-09 MED ORDER — PROPOFOL 10 MG/ML IV BOLUS
INTRAVENOUS | Status: AC
  Filled 2019-11-09: qty 20

## 2019-11-09 MED ORDER — HYDROMORPHONE HCL 1 MG/ML IJ SOLN
INTRAMUSCULAR | Status: AC
  Filled 2019-11-09: qty 1

## 2019-11-09 MED ORDER — MIDAZOLAM HCL 2 MG/2ML IJ SOLN
INTRAMUSCULAR | Status: AC
  Filled 2019-11-09: qty 2

## 2019-11-09 MED ORDER — SODIUM CHLORIDE 0.9 % IR SOLN
Status: DC | PRN
  Administered 2019-11-09: 3000 mL

## 2019-11-09 SURGICAL SUPPLY — 67 items
APPLICATOR COTTON TIP 6 STRL (MISCELLANEOUS) ×3 IMPLANT
APPLICATOR COTTON TIP 6IN STRL (MISCELLANEOUS) ×4
CANISTER SUCT 3000ML PPV (MISCELLANEOUS) ×4 IMPLANT
COVER BACK TABLE 60X90IN (DRAPES) ×4 IMPLANT
COVER TIP SHEARS 8 DVNC (MISCELLANEOUS) ×3 IMPLANT
COVER TIP SHEARS 8MM DA VINCI (MISCELLANEOUS) ×1
COVER WAND RF STERILE (DRAPES) ×4 IMPLANT
DECANTER SPIKE VIAL GLASS SM (MISCELLANEOUS) ×7 IMPLANT
DEFOGGER SCOPE WARMER CLEARIFY (MISCELLANEOUS) ×4 IMPLANT
DERMABOND ADVANCED (GAUZE/BANDAGES/DRESSINGS) ×1
DERMABOND ADVANCED .7 DNX12 (GAUZE/BANDAGES/DRESSINGS) ×3 IMPLANT
DRAPE ARM DVNC X/XI (DISPOSABLE) ×12 IMPLANT
DRAPE COLUMN DVNC XI (DISPOSABLE) ×3 IMPLANT
DRAPE DA VINCI XI ARM (DISPOSABLE) ×4
DRAPE DA VINCI XI COLUMN (DISPOSABLE) ×1
DURAPREP 26ML APPLICATOR (WOUND CARE) ×4 IMPLANT
ELECT REM PT RETURN 9FT ADLT (ELECTROSURGICAL) ×8
ELECTRODE REM PT RTRN 9FT ADLT (ELECTROSURGICAL) ×3 IMPLANT
GAUZE 4X4 16PLY RFD (DISPOSABLE) ×4 IMPLANT
GLOVE BIO SURGEON STRL SZ 6.5 (GLOVE) ×3 IMPLANT
GLOVE BIO SURGEON STRL SZ7 (GLOVE) ×4 IMPLANT
GLOVE BIOGEL PI IND STRL 6.5 (GLOVE) IMPLANT
GLOVE BIOGEL PI IND STRL 7.0 (GLOVE) ×15 IMPLANT
GLOVE BIOGEL PI IND STRL 8.5 (GLOVE) IMPLANT
GLOVE BIOGEL PI INDICATOR 6.5 (GLOVE) ×3
GLOVE BIOGEL PI INDICATOR 7.0 (GLOVE) ×6
GLOVE BIOGEL PI INDICATOR 8.5 (GLOVE) ×1
GLOVE ECLIPSE 6.5 STRL STRAW (GLOVE) ×12 IMPLANT
GLOVE SURG SS PI 8.5 STRL IVOR (GLOVE) ×2
GLOVE SURG SS PI 8.5 STRL STRW (GLOVE) IMPLANT
GOWN STRL REUS W/TWL LRG LVL3 (GOWN DISPOSABLE) ×8 IMPLANT
HIBICLENS CHG 4% 4OZ (MISCELLANEOUS) ×4 IMPLANT
IRRIG SUCT STRYKERFLOW 2 WTIP (MISCELLANEOUS) ×4
IRRIGATION SUCT STRKRFLW 2 WTP (MISCELLANEOUS) ×3 IMPLANT
LEGGING LITHOTOMY PAIR STRL (DRAPES) ×4 IMPLANT
NEEDLE HYPO 22GX1.5 SAFETY (NEEDLE) ×4 IMPLANT
NS IRRIG 1000ML POUR BTL (IV SOLUTION) ×1 IMPLANT
OBTURATOR OPTICAL STANDARD 8MM (TROCAR) ×1
OBTURATOR OPTICAL STND 8 DVNC (TROCAR) ×3
OBTURATOR OPTICALSTD 8 DVNC (TROCAR) ×3 IMPLANT
OCCLUDER COLPOPNEUMO (BALLOONS) ×4 IMPLANT
PACK LAPAROSCOPY BASIN (CUSTOM PROCEDURE TRAY) ×4 IMPLANT
PACK ROBOT WH (CUSTOM PROCEDURE TRAY) ×4 IMPLANT
PACK ROBOTIC GOWN (GOWN DISPOSABLE) ×4 IMPLANT
PACK TRENDGUARD 450 HYBRID PRO (MISCELLANEOUS) ×3 IMPLANT
PAD PREP 24X48 CUFFED NSTRL (MISCELLANEOUS) ×4 IMPLANT
POUCH LAPAROSCOPIC INSTRUMENT (MISCELLANEOUS) ×4 IMPLANT
POUCH SPECIMEN RETRIEVAL 10MM (ENDOMECHANICALS) IMPLANT
PROTECTOR NERVE ULNAR (MISCELLANEOUS) ×12 IMPLANT
SCISSORS LAP 5X35 DISP (ENDOMECHANICALS) IMPLANT
SEAL CANN UNIV 5-8 DVNC XI (MISCELLANEOUS) ×9 IMPLANT
SEAL XI 5MM-8MM UNIVERSAL (MISCELLANEOUS) ×3
SET TRI-LUMEN FLTR TB AIRSEAL (TUBING) ×4 IMPLANT
SET TUBE SMOKE EVAC HIGH FLOW (TUBING) ×4 IMPLANT
SLEEVE XCEL OPT CAN 5 100 (ENDOMECHANICALS) ×4 IMPLANT
STRIP CLOSURE SKIN 1/4X4 (GAUZE/BANDAGES/DRESSINGS) ×4 IMPLANT
SUT MNCRL AB 3-0 PS2 27 (SUTURE) ×8 IMPLANT
SUT VIC AB 0 CT1 27 (SUTURE) ×2
SUT VIC AB 0 CT1 27XBRD ANBCTR (SUTURE) ×6 IMPLANT
SUT VICRYL 0 UR6 27IN ABS (SUTURE) ×9 IMPLANT
TIP UTERINE 6.7X8CM BLUE DISP (MISCELLANEOUS) ×1 IMPLANT
TOWEL OR 17X26 10 PK STRL BLUE (TOWEL DISPOSABLE) ×7 IMPLANT
TRAY FOLEY W/BAG SLVR 14FR (SET/KITS/TRAYS/PACK) ×4 IMPLANT
TRENDGUARD 450 HYBRID PRO PACK (MISCELLANEOUS) ×4
TROCAR BALLN 12MMX100 BLUNT (TROCAR) ×4 IMPLANT
TROCAR PORT AIRSEAL 8X120 (TROCAR) ×4 IMPLANT
TROCAR XCEL NON-BLD 5MMX100MML (ENDOMECHANICALS) ×4 IMPLANT

## 2019-11-09 NOTE — Transfer of Care (Signed)
Immediate Anesthesia Transfer of Care Note  Patient: Angela Anthony  Procedure(s) Performed: Procedure(s) (LRB): XI ROBOTIC ASSISTED DIAGNOSTIC LAPAROSCOPY AND LYSIS OF ADHESION AND RESECTION OF PELVIC ENDOMETRIOSIS AND RIGHT OVARIAN CYSTECTOMY (Bilateral) CHROMOPERTUBATION (Bilateral)  Patient Location: PACU  Anesthesia Type: General  Level of Consciousness: awake, sedated, patient cooperative and responds to stimulation  Airway & Oxygen Therapy: Patient Spontanous Breathing and Patient connected to [place] 02 and soft FM   Post-op Assessment: Report given to PACU RN, Post -op Vital signs reviewed and stable and Patient moving all extremities  Post vital signs: Reviewed and stable  Complications: No apparent anesthesia complications

## 2019-11-09 NOTE — Anesthesia Preprocedure Evaluation (Addendum)
Anesthesia Evaluation  Patient identified by MRN, date of birth, ID band Patient awake  General Assessment Comment:Recalls shaking a lot after wisdom teeth extraction  Reviewed: Allergy & Precautions, NPO status , Patient's Chart, lab work & pertinent test results  History of Anesthesia Complications Negative for: history of anesthetic complications  Airway Mallampati: II  TM Distance: >3 FB Neck ROM: Full    Dental no notable dental hx. (+) Teeth Intact, Dental Advisory Given   Pulmonary neg pulmonary ROS,    Pulmonary exam normal breath sounds clear to auscultation       Cardiovascular negative cardio ROS Normal cardiovascular exam Rhythm:Regular Rate:Normal     Neuro/Psych  Headaches, negative psych ROS   GI/Hepatic Neg liver ROS, GERD  ,  Endo/Other  negative endocrine ROS  Renal/GU negative Renal ROS  Female GU complaint Pelvic pain, endometriosis     Musculoskeletal negative musculoskeletal ROS (+)   Abdominal   Peds  Hematology negative hematology ROS (+)   Anesthesia Other Findings Day of surgery medications reviewed with the patient.  Reproductive/Obstetrics negative OB ROS                            Anesthesia Physical Anesthesia Plan  ASA: I  Anesthesia Plan: General   Post-op Pain Management:    Induction: Intravenous  PONV Risk Score and Plan: 4 or greater and Ondansetron, Dexamethasone, Midazolam, Scopolamine patch - Pre-op and Treatment may vary due to age or medical condition  Airway Management Planned: Oral ETT  Additional Equipment: None  Intra-op Plan:   Post-operative Plan: Extubation in OR  Informed Consent: I have reviewed the patients History and Physical, chart, labs and discussed the procedure including the risks, benefits and alternatives for the proposed anesthesia with the patient or authorized representative who has indicated his/her  understanding and acceptance.     Dental advisory given  Plan Discussed with: CRNA  Anesthesia Plan Comments:        Anesthesia Quick Evaluation

## 2019-11-09 NOTE — Anesthesia Procedure Notes (Signed)
Procedure Name: Intubation Date/Time: 11/09/2019 2:01 PM Performed by: Justice Rocher, CRNA Pre-anesthesia Checklist: Patient identified, Emergency Drugs available, Suction available and Patient being monitored Patient Re-evaluated:Patient Re-evaluated prior to induction Oxygen Delivery Method: Circle system utilized Preoxygenation: Pre-oxygenation with 100% oxygen Induction Type: IV induction Ventilation: Mask ventilation without difficulty Laryngoscope Size: Mac and 3 Grade View: Grade I Tube type: Oral Tube size: 7.5 mm Number of attempts: 1 Airway Equipment and Method: Stylet and Oral airway Placement Confirmation: ETT inserted through vocal cords under direct vision,  positive ETCO2,  breath sounds checked- equal and bilateral and CO2 detector Secured at: 22 cm Tube secured with: Tape Dental Injury: Teeth and Oropharynx as per pre-operative assessment

## 2019-11-09 NOTE — Discharge Instructions (Signed)
Call [facility] @ [phone] if:  You have a temperature greater than or equal to 100.4 degrees Farenheit orally You have pain that is not made better by the pain medication given and taken as directed You have excessive bleeding or problems urinating  Take Colace (Docusate Sodium/Stool Softener) 100 mg 2-3 times daily while taking narcotic pain medicine to avoid constipation or until bowel movements are regular.  Take Ketorolac 10 mg  with food every 6 hours along with Tylenol 1000 mg every 6 hours for 3 days then either as needed for pain thereafter.   First dose at 10:45  p.m. the day of surgery  You may drive after 24 hours You may walk up steps  You may shower tomorrow You may resume a regular diet  Keep incisions clean and dry Do not lift over 15 pounds for 6 weeks Avoid anything in vagina until after your post-operative visit   Post Anesthesia Home Care Instructions  Activity: Get plenty of rest for the remainder of the day. A responsible individual must stay with you for 24 hours following the procedure.  For the next 24 hours, DO NOT: -Drive a car -Advertising copywriter -Drink alcoholic beverages -Take any medication unless instructed by your physician -Make any legal decisions or sign important papers.  Meals: Start with liquid foods such as gelatin or soup. Progress to regular foods as tolerated. Avoid greasy, spicy, heavy foods. If nausea and/or vomiting occur, drink only clear liquids until the nausea and/or vomiting subsides. Call your physician if vomiting continues.  Special Instructions/Symptoms: Your throat may feel dry or sore from the anesthesia or the breathing tube placed in your throat during surgery. If this causes discomfort, gargle with warm salt water. The discomfort should disappear within 24 hours.  If you had a scopolamine patch placed behind your ear for the management of post- operative nausea and/or vomiting:  1. The medication in  the patch is effective for 72 hours, after which it should be removed.  Wrap patch in a tissue and discard in the trash. Wash hands thoroughly with soap and water. 2. You may remove the patch earlier than 72 hours if you experience unpleasant side effects which may include dry mouth, dizziness or visual disturbances. 3. Avoid touching the patch. Wash your hands with soap and water after contact with the patch.

## 2019-11-09 NOTE — Op Note (Signed)
Preoperative diagnosis: Chronic pelvic pain with suspicion of endometriosis  Postoperative diagnosis: Stage 4 endometriosis (right endometrioma), pelvic adhesions, right paratubal cyst, right tubal cyst  Anesthesia: General   Anesthesiologist: Dr. Armond Hang  Procedure: Diagnostic laparoscopy, Robotically assisted right ovarian cystectomy with resection of endometriosis and lysis of adhesions, chromopertubation  Surgeon: Dr. Dois Davenport Caetano Oberhaus   Assistant: Henreitta Leber P.A.-C .  Estimated blood loss: 25 cc   Procedure:   After being informed of the planned procedure with possible complications including but not limited to bleeding, infection, injury to other organs, need for laparotomy, expected hospital stay and recovery, informed consent is obtained and patient is taken to or #6. She is placed in lithotomy position on Trengard with both arms padded and tucked on each side and bilateral knee-high sequential compressive devices. She is given general anesthesia with endotracheal intubation without any complication. She is prepped and draped in a sterile fashion. A three-way Foley catheter is inserted in her bladder.   Pelvic exam reveals: retroverted normal size uterus and 2 normal adnexa  A weighted speculum is inserted in the vagina and the anterior lip of the cervix is grasped with a tenaculum forcep. The uterus was then sounded at 8 cm. We easily dilate the cervix using Hegar dilator to #27 which allows for easy placement of the intrauterine RUMI manipulator.  Trocar placement is decided. We infiltrate  the umbilicus with 10 cc of ropivacaine per protocol and perform a 10 mm semi-elliptical incision which is brought down bluntly to the fascia. The fascia is identified and grasped with Coker forceps. The fascia is incised with Mayo scissors. Peritoneum is entered bluntly. A pursestring suture of 0 Vicryl is placed on the fascia and a 10 mm Hassan trocar is easily inserted in the abdominal cavity  held in placed with a Purstring suture. This allows for easy insufflation of a pneumoperitoneum using warmed CO2 at a maximum pressure of 15 mm of mercury. 60 cc of Ropivacaine 0.5 % diluted 1 in 1 is sent in the pelvis and the patient is positioned in reverse Trendelenburg. We then placed one 47mm robotic trocar on the left, one 45mm robotic trocar on the right and one 10 mm patient's side assistant trocar on the right after infiltrating every site with ropivacaine per protocol. The robot is docked on the right of the patient after positioning her in Trendelenburg. A monopolar scissor is inserted in arm #4 and a Long Bipolar forceps is inserted in arm #2.  Preparation and docking is completed in 65 minutes.   Observation: Uterus is normal. Left tube is normal, a 1 cm cyst is seen on the proximal/cornual portion of right tube.1 cm right paratubal cyst. Anterior cul-de-sac is normal. Posterior cul-de-sac is 75% covered with scattered clear and red endometriotic lesions and a 1 cm deep peritoneal retraction over the left utero-sacral ligament. The left ovary is mobile with superficial 1 mm brown lesions. The left pelvic wall is 50% covered with small scattered clear and red endometriotic lesions.We note Grade 1-2 adhesions between the sigmoid colon and the left pelvic wall. The right ovary has a 3 cm cyst that is adherent to the pelvic wall rupturing upon mobilization with chocolate thick fluid.    Liver is visualized and normal. Appendix is  Visualized and normal   60 cc of ropivacaine 0.25% is placed in the pelvis.  Using monopolar scissors and Long Bipolar Forceps, we utilize the ruptured area of the right ovarian cyst to fully remove the ovarian cyst  wall.  We irrigated profusely and complete hemostasis using bipolar cauterization.   The cyst wall is removed from the abdomen through the patient's side assistant trocar on the right.  We then proceed with systematic resection of endometriotic lesions removing  most of the posterior cul-de-sac and left pelvic wall peritoneum.  The left ureter remained under direct visualization during the whole dissection.  In order to better view the ureter pelvic adhesions are sharply taken down between the sigmoid colon and the left pelvic wall.  We irrigated profusely and confirm a satisfactory hemostasis.  The endometriotic lesions on the left ovary are cauterized using monopolar and bipolar cauterization.  We remove the right paratubal cyst using monopolar scissors.  Again hemostasis is confirmed after profuse irrigation with warm saline.  We then proceeded with chromopertubation which reveals easy passage and complete patency of the left tube with partial passage of dye to the right tube stopping at approximately 1 cm from the cornual area.  Again the tubes are free of adhesions with normal fimbria.  We irrigate to remove methylene blue from the abdomen.   Console time is completed in 1hour and 10 minutes.  Total procedure time is 2 hours and 42 minutes  All instruments are then removed and the robot is undocked.   All trochars are removed under direct visualization after evacuating the pneumoperitoneum.   The fascia of the supraumbilical incision is closed with the previously placed pursestring suture of 0 Vicryl.  All incisions are then closed with subcuticular suture of 3-0 Monocryl and Dermabond.   A speculum is inserted in the vagina.  A figure-of-eight suture of 0 Vicryl is placed to obtain adequate hemostasis on the cervix.   Instrument and sponge count is complete x2. Estimated blood loss is 25 cc.   The procedure is well tolerated by the patient who  is taken to recovery room in a well and stable condition.   Specimen: Right ovarian cyst wall, posterior cul-de-sac peritoneum, left ovarian fossa peritoneum, right paratubal cyst sent to pathology

## 2019-11-09 NOTE — Interval H&P Note (Signed)
History and Physical Interval Note:  11/09/2019 1:45 PM  Angela Anthony  has presented today for surgery, with the diagnosis of Pelvic Pain with Suspicion of Endometriosis.  The various methods of treatment have been discussed with the patient and family. After consideration of risks, benefits and other options for treatment, the patient has consented to  Procedure(s) with comments: LAPAROSCOPIC LYSIS OF ADHESIONS (Bilateral) - Post case for 2.5 Hours POSSIBLE XI ROBOTIC ASSISTED LAPAROSCOPIC LYSIS OF ADHESION (Bilateral) CHROMOPERTUBATION (Bilateral) as a surgical intervention.  The patient's history has been reviewed, patient examined, no change in status, stable for surgery.  I have reviewed the patient's chart and labs.  Questions were answered to the patient's satisfaction.     Dois Davenport A Jack Bolio

## 2019-11-10 NOTE — Addendum Note (Signed)
Addendum  created 11/10/19 1325 by Elmer Picker, MD   Clinical Note Signed

## 2019-11-10 NOTE — Anesthesia Postprocedure Evaluation (Addendum)
Anesthesia Post Note  Patient: Angela Anthony  Procedure(s) Performed: XI ROBOTIC ASSISTED LYSIS OF ADHESION AND RESECTION OF PELVIC ENDOMETRIOSIS AND RIGHT OVARIAN CYSTECTOMY (Bilateral Abdomen) CHROMOPERTUBATION (Bilateral ) LAPAROSCOPY DIAGNOSTIC     Patient location during evaluation: PACU Anesthesia Type: General Level of consciousness: awake and alert Pain management: pain level controlled Vital Signs Assessment: post-procedure vital signs reviewed and stable Respiratory status: spontaneous breathing, nonlabored ventilation, respiratory function stable and patient connected to nasal cannula oxygen Cardiovascular status: blood pressure returned to baseline and stable Postop Assessment: no apparent nausea or vomiting Anesthetic complications: yes Anesthetic complication details: injury of skin or soft tissueComments: Upon follow up call the next day, pt reported right tonsillar trauma. Called and spoke to the patient. She reports a right tonsillar laceration that she noticed after she was having pain with swallowing. She started salt water gargles and states the pain has improved today. She denies bleeding, swelling, or difficulty breathing. I apologized that she sustained this injury and reassured her that it would improve over the next week. I encouraged her to continue with the salt water gargles and suggested cold water to soothe the area as well. I educated her about concerning symptoms such as bleeding, swelling, difficulty breathing/swalllowing, or signs of infection. I also told her that if her symptoms do not improve or worsen, to call us and we would have her see ENT.     Last Vitals:  Vitals:   11/09/19 1830 11/09/19 1930  BP: 121/66 123/84  Pulse: 87 98  Resp: 18 14  Temp:  36.9 C  SpO2: 99% 96%    Last Pain:  Vitals:   11/10/19 0959  TempSrc:   PainSc: 5                  Kellis Mcadam L Zuleika Gallus

## 2019-11-11 LAB — SURGICAL PATHOLOGY

## 2019-11-14 ENCOUNTER — Other Ambulatory Visit: Payer: Self-pay | Admitting: Obstetrics and Gynecology

## 2019-11-14 DIAGNOSIS — R109 Unspecified abdominal pain: Secondary | ICD-10-CM

## 2019-11-22 ENCOUNTER — Other Ambulatory Visit: Payer: 59
